# Patient Record
Sex: Male | Born: 1995 | Race: Black or African American | Hispanic: No | Marital: Single | Smoking: Never smoker
Health system: Southern US, Community
[De-identification: ages and names within clinical notes are randomized; demographics above are authoritative.]

---

## 2017-07-02 ENCOUNTER — Encounter (HOSPITAL_COMMUNITY): Payer: Self-pay | Admitting: *Deleted

## 2017-07-02 ENCOUNTER — Ambulatory Visit (HOSPITAL_COMMUNITY)
Admission: EM | Admit: 2017-07-02 | Discharge: 2017-07-02 | Disposition: A | Discharge status: Home / Self Care | Payer: BLUE CROSS/BLUE SHIELD | Attending: Family Medicine | Admitting: Family Medicine

## 2017-07-02 DIAGNOSIS — Z025 Encounter for examination for participation in sport: Secondary | ICD-10-CM

## 2017-07-02 DIAGNOSIS — Z130 Encounter for screening for diseases of the blood and blood-forming organs and certain disorders involving the immune mechanism: Secondary | ICD-10-CM | POA: Insufficient documentation

## 2017-07-02 NOTE — ED Triage Notes (Addendum)
Patient here today to have labs drawn for sickle cell test for football practice. No symptoms and no history.

## 2017-07-04 LAB — SICKLE CELL SCREEN: Sickle Cell Screen: NEGATIVE

## 2017-09-09 ENCOUNTER — Emergency Department (HOSPITAL_COMMUNITY)
Admission: EM | Admit: 2017-09-09 | Discharge: 2017-09-09 | Disposition: A | Discharge status: Home / Self Care | Payer: BLUE CROSS/BLUE SHIELD | Attending: Emergency Medicine | Admitting: Emergency Medicine

## 2017-09-09 ENCOUNTER — Emergency Department (HOSPITAL_COMMUNITY): Payer: BLUE CROSS/BLUE SHIELD

## 2017-09-09 DIAGNOSIS — Y999 Unspecified external cause status: Secondary | ICD-10-CM | POA: Insufficient documentation

## 2017-09-09 DIAGNOSIS — S83412A Sprain of medial collateral ligament of left knee, initial encounter: Secondary | ICD-10-CM | POA: Diagnosis not present

## 2017-09-09 DIAGNOSIS — W500XXA Accidental hit or strike by another person, initial encounter: Secondary | ICD-10-CM | POA: Diagnosis not present

## 2017-09-09 DIAGNOSIS — Y9361 Activity, american tackle football: Secondary | ICD-10-CM | POA: Diagnosis not present

## 2017-09-09 DIAGNOSIS — S8992XA Unspecified injury of left lower leg, initial encounter: Secondary | ICD-10-CM | POA: Diagnosis present

## 2017-09-09 DIAGNOSIS — Y929 Unspecified place or not applicable: Secondary | ICD-10-CM | POA: Insufficient documentation

## 2017-09-09 MED ORDER — MELOXICAM 15 MG PO TABS: 15.0000 mg | ORAL_TABLET | Freq: Every day | ORAL | 0 refills | Status: AC

## 2017-09-09 NOTE — ED Triage Notes (Signed)
Hurt left knee playing football yesterday, swollen

## 2017-09-09 NOTE — Progress Notes (Signed)
Orthopedic Tech Progress Note Patient Details:  Dennis Dillon [birth year] [id number]  Ortho Devices Type of Ortho Device: Crutches, Knee Immobilizer Ortho Device/Splint Interventions: Application   [name] 09/09/2017, 4:21 PM

## 2017-09-09 NOTE — Discharge Instructions (Signed)
Contact a health care provider if: °You have pain that gets worse. °The cast, brace, or splint does not fit right. °The cast, brace, or splint gets damaged. °Get help right away if: °You cannot use your injured joint to support any of your body weight (cannot bear weight). °You cannot move the injured joint. °You cannot walk more than a few steps without pain or without your knee buckling. °You have significant pain, swelling, or numbness below the cast, brace, or splint. °
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
## 2017-12-10 ENCOUNTER — Other Ambulatory Visit: Payer: Self-pay

## 2017-12-10 ENCOUNTER — Encounter (HOSPITAL_COMMUNITY): Payer: Self-pay | Admitting: Emergency Medicine

## 2017-12-10 ENCOUNTER — Emergency Department (HOSPITAL_COMMUNITY)
Admission: EM | Admit: 2017-12-10 | Discharge: 2017-12-10 | Disposition: A | Discharge status: Home / Self Care | Payer: Federal, State, Local not specified - PPO | Attending: Emergency Medicine | Admitting: Emergency Medicine

## 2017-12-10 ENCOUNTER — Emergency Department (HOSPITAL_COMMUNITY): Payer: Federal, State, Local not specified - PPO

## 2017-12-10 DIAGNOSIS — R0789 Other chest pain: Secondary | ICD-10-CM | POA: Diagnosis not present

## 2017-12-10 MED ORDER — CYCLOBENZAPRINE HCL 10 MG PO TABS: 10.0000 mg | ORAL_TABLET | Freq: Every evening | ORAL | 0 refills | Status: AC | PRN

## 2017-12-10 MED ORDER — IBUPROFEN 400 MG PO TABS
600.0000 mg | ORAL_TABLET | Freq: Once | ORAL | Status: AC
  Administered 2017-12-10: 600 mg via ORAL
  Filled 2017-12-10: qty 1

## 2017-12-10 NOTE — Discharge Instructions (Signed)
Take Ibuprofen or Tylenol for pain Take muscle relaxer as needed for pain. This medicine can make you sleepy Return if worsening
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
